# Patient Record
Sex: Male | Born: 1985 | Race: White | Hispanic: No | Marital: Single | Smoking: Never smoker
Health system: Southern US, Community
[De-identification: ages and names within clinical notes are randomized; demographics above are authoritative.]

## PROBLEM LIST (undated history)

## (undated) HISTORY — PX: MOUTH SURGERY: SHX715

---

## 2012-07-26 DIAGNOSIS — Y92410 Unspecified street and highway as the place of occurrence of the external cause: Secondary | ICD-10-CM | POA: Insufficient documentation

## 2012-07-26 DIAGNOSIS — Y9389 Activity, other specified: Secondary | ICD-10-CM | POA: Insufficient documentation

## 2012-07-26 DIAGNOSIS — M62838 Other muscle spasm: Secondary | ICD-10-CM | POA: Insufficient documentation

## 2012-07-27 ENCOUNTER — Emergency Department (HOSPITAL_BASED_OUTPATIENT_CLINIC_OR_DEPARTMENT_OTHER): Payer: No Typology Code available for payment source

## 2012-07-27 ENCOUNTER — Emergency Department (HOSPITAL_BASED_OUTPATIENT_CLINIC_OR_DEPARTMENT_OTHER)
Admission: EM | Admit: 2012-07-27 | Discharge: 2012-07-27 | Disposition: A | Discharge status: Home / Self Care | Payer: No Typology Code available for payment source | Attending: Emergency Medicine | Admitting: Emergency Medicine

## 2012-07-27 ENCOUNTER — Encounter (HOSPITAL_BASED_OUTPATIENT_CLINIC_OR_DEPARTMENT_OTHER): Payer: Self-pay | Admitting: Emergency Medicine

## 2012-07-27 DIAGNOSIS — M62838 Other muscle spasm: Secondary | ICD-10-CM

## 2012-07-27 MED ORDER — IBUPROFEN 400 MG PO TABS
400.0000 mg | ORAL_TABLET | Freq: Once | ORAL | Status: AC
  Administered 2012-07-27: 400 mg via ORAL

## 2012-07-27 MED ORDER — IBUPROFEN 600 MG PO TABS: 600.0000 mg | ORAL_TABLET | Freq: Four times a day (QID) | ORAL | Status: DC | PRN

## 2012-07-27 MED ORDER — IBUPROFEN 400 MG PO TABS: 400.0000 mg | ORAL_TABLET | Freq: Once | ORAL | Status: DC

## 2012-07-27 MED ORDER — IBUPROFEN 400 MG PO TABS
ORAL_TABLET | ORAL | Status: AC
  Administered 2012-07-27: 01:00:00
  Filled 2012-07-27: qty 1
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
## 2012-11-16 ENCOUNTER — Emergency Department
Admission: EM | Admit: 2012-11-16 | Discharge: 2012-11-16 | Disposition: A | Discharge status: Home / Self Care | Payer: Self-pay | Source: Home / Self Care | Attending: Family Medicine | Admitting: Family Medicine

## 2012-11-16 ENCOUNTER — Encounter: Payer: Self-pay | Admitting: Emergency Medicine

## 2012-11-16 DIAGNOSIS — H1045 Other chronic allergic conjunctivitis: Secondary | ICD-10-CM

## 2012-11-16 DIAGNOSIS — H1013 Acute atopic conjunctivitis, bilateral: Secondary | ICD-10-CM

## 2012-11-16 MED ORDER — KETOROLAC TROMETHAMINE 0.5 % OP SOLN: 1.0000 [drp] | Freq: Four times a day (QID) | OPHTHALMIC | Status: DC

## 2012-11-16 NOTE — ED Notes (Signed)
Correction: patient states conjunctivitis began about 1 month ago.

## 2012-11-16 NOTE — ED Notes (Signed)
Reports bilateral conjunctivitis x 3 months; wonders if it started with exposure to cedar tree. Some lid edema.
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
## 2012-11-17 ENCOUNTER — Emergency Department
Admission: EM | Admit: 2012-11-17 | Discharge: 2012-11-17 | Disposition: A | Discharge status: Home / Self Care | Payer: BC Managed Care – PPO | Source: Home / Self Care | Attending: Family Medicine | Admitting: Family Medicine

## 2012-11-17 ENCOUNTER — Encounter: Payer: Self-pay | Admitting: *Deleted

## 2012-11-17 DIAGNOSIS — N390 Urinary tract infection, site not specified: Secondary | ICD-10-CM

## 2012-11-17 LAB — POCT URINALYSIS DIP (MANUAL ENTRY)
Nitrite, UA: NEGATIVE
pH, UA: 7 (ref 5–8)

## 2012-11-17 MED ORDER — NITROFURANTOIN MONOHYD MACRO 100 MG PO CAPS: 100.0000 mg | ORAL_CAPSULE | Freq: Two times a day (BID) | ORAL | Status: DC

## 2012-11-17 NOTE — ED Notes (Signed)
Erik Doyle c/o hematuria and dysuria x this AM

## 2012-11-17 NOTE — ED Provider Notes (Signed)
History    CSN: [id number] Arrival date & time 11/17/12  9528  First MD Initiated Contact with Patient 11/17/12 0932     Chief Complaint  Patient presents with  . Hematuria  . Dysuria     HPI Comments: Patient noticed hematuria this morning, followed by dysuria.  No urgency or hesitancy.  No flank pain.  No fevers, chills, and sweats.  No nausea.  No urethral discharge or testicular pain. He states that he was treated for a kidney infection about 1.5 months ago, initially with Cipro, and changed to Macrobid based on culture results.  No past history of kidney stones.  No family history of kidney stones.  Patient is a 27 y.o. male presenting with hematuria. The history is provided by the patient.  Hematuria This is a new problem. The current episode started less than 1 hour ago. The problem has not changed since onset.Associated symptoms comments: dysuria. Nothing aggravates the symptoms. Nothing relieves the symptoms. He has tried nothing for the symptoms.   History reviewed. No pertinent past medical history. Past Surgical History  Procedure Laterality Date  . Mouth surgery     Family History  Problem Relation Age of Onset  . Hypertension Father    History  Substance Use Topics  . Smoking status: Never Smoker   . Smokeless tobacco: Never Used  . Alcohol Use: Yes     Comment: socially    Review of Systems  Genitourinary: Positive for hematuria.  All other systems reviewed and are negative.    Allergies  Review of patient's allergies indicates no known allergies.  Home Medications   Current Outpatient Rx  Name  Route  Sig  Dispense  Refill  . ibuprofen (ADVIL,MOTRIN) 600 MG tablet   Oral   Take 1 tablet (600 mg total) by mouth every 6 (six) hours as needed for pain.   30 tablet   0   . ketorolac (ACULAR) 0.5 % ophthalmic solution   Both Eyes   Place 1 drop into both eyes 4 (four) times daily.   5 mL   1   . nitrofurantoin, macrocrystal-monohydrate,  (MACROBID) 100 MG capsule   Oral   Take 1 capsule (100 mg total) by mouth 2 (two) times daily.   14 capsule   0    BP 134/83  Pulse 88  Temp(Src) 98.4 F (36.9 C) (Oral)  Resp 12  Ht 5\' 7"  (1.702 m)  Wt 208 lb (94.348 kg)  BMI 32.57 kg/m2  SpO2 98% Physical Exam Nursing notes and Vital Signs reviewed. Appearance:  Patient appears healthy, stated age, and in no acute distress Eyes:  Pupils are equal, round, and reactive to light and accomodation.  Extraocular movement is intact.  Conjunctivae are not inflamed  Mouth/Pharynx:  Normal; moist mucous membranes  Neck:  Supple.  No adenopathy Lungs:  Clear to auscultation.  Breath sounds are equal.  Heart:  Regular rate and rhythm without murmurs, rubs, or gallops.  Abdomen:  Nontender without masses or hepatosplenomegaly.  Bowel sounds are present.  Mild right flank tenderness is present.  Extremities:  No edema.   Skin:  No rash present.   ED Course  Procedures  none  Labs Reviewed  URINE CULTURE pending  POCT URINALYSIS DIP (MANUAL ENTRY):  BIL small; KET trace; BLO large; PRO >= 300 mg/dL; LEU moderate    1. Urinary tract infection, site not specified     MDM  Urine culture pending Empirically begin Macrobid. Increase fluid intake.  Discussed red flags Followup with urologist one week if not improving.  Consider followup with PCP or urologist in one week for repeat urine culture.  Lattie Haw, MD 11/17/12 1013

## 2012-11-19 ENCOUNTER — Telehealth: Payer: Self-pay | Admitting: *Deleted

## 2013-08-23 ENCOUNTER — Emergency Department (HOSPITAL_COMMUNITY): Payer: BC Managed Care – PPO

## 2013-08-23 ENCOUNTER — Emergency Department (HOSPITAL_COMMUNITY)
Admission: EM | Admit: 2013-08-23 | Discharge: 2013-08-23 | Disposition: A | Discharge status: Home / Self Care | Payer: BC Managed Care – PPO | Attending: Emergency Medicine | Admitting: Emergency Medicine

## 2013-08-23 ENCOUNTER — Encounter (HOSPITAL_COMMUNITY): Payer: Self-pay | Admitting: Emergency Medicine

## 2013-08-23 DIAGNOSIS — K921 Melena: Secondary | ICD-10-CM | POA: Insufficient documentation

## 2013-08-23 DIAGNOSIS — Z792 Long term (current) use of antibiotics: Secondary | ICD-10-CM | POA: Insufficient documentation

## 2013-08-23 DIAGNOSIS — Z79899 Other long term (current) drug therapy: Secondary | ICD-10-CM | POA: Insufficient documentation

## 2013-08-23 DIAGNOSIS — Z791 Long term (current) use of non-steroidal anti-inflammatories (NSAID): Secondary | ICD-10-CM | POA: Insufficient documentation

## 2013-08-23 DIAGNOSIS — E860 Dehydration: Secondary | ICD-10-CM | POA: Insufficient documentation

## 2013-08-23 DIAGNOSIS — A088 Other specified intestinal infections: Secondary | ICD-10-CM | POA: Insufficient documentation

## 2013-08-23 DIAGNOSIS — A084 Viral intestinal infection, unspecified: Secondary | ICD-10-CM

## 2013-08-23 LAB — CBC WITH DIFFERENTIAL/PLATELET
BASOS ABS: 0 10*3/uL (ref 0.0–0.1)
BASOS PCT: 0 % (ref 0–1)
Eosinophils Absolute: 0.3 10*3/uL (ref 0.0–0.7)
Eosinophils Relative: 2 % (ref 0–5)
HEMATOCRIT: 43.9 % (ref 39.0–52.0)
Hemoglobin: 15.6 g/dL (ref 13.0–17.0)
Lymphocytes Relative: 10 % — ABNORMAL LOW (ref 12–46)
Lymphs Abs: 1.8 10*3/uL (ref 0.7–4.0)
MCH: 29.9 pg (ref 26.0–34.0)
MCHC: 35.5 g/dL (ref 30.0–36.0)
MCV: 84.3 fL (ref 78.0–100.0)
MONOS PCT: 11 % (ref 3–12)
Monocytes Absolute: 2 10*3/uL — ABNORMAL HIGH (ref 0.1–1.0)
NEUTROS ABS: 13.5 10*3/uL — AB (ref 1.7–7.7)
NEUTROS PCT: 77 % (ref 43–77)
Platelets: 297 10*3/uL (ref 150–400)
RBC: 5.21 MIL/uL (ref 4.22–5.81)
RDW: 13.1 % (ref 11.5–15.5)
WBC: 17.6 10*3/uL — AB (ref 4.0–10.5)

## 2013-08-23 LAB — URINALYSIS, ROUTINE W REFLEX MICROSCOPIC
BILIRUBIN URINE: NEGATIVE
GLUCOSE, UA: NEGATIVE mg/dL
Hgb urine dipstick: NEGATIVE
KETONES UR: NEGATIVE mg/dL
LEUKOCYTES UA: NEGATIVE
Nitrite: NEGATIVE
PH: 5.5 (ref 5.0–8.0)
Protein, ur: NEGATIVE mg/dL
Specific Gravity, Urine: 1.025 (ref 1.005–1.030)
Urobilinogen, UA: 0.2 mg/dL (ref 0.0–1.0)

## 2013-08-23 LAB — LIPASE, BLOOD: Lipase: 34 U/L (ref 11–59)

## 2013-08-23 LAB — COMPREHENSIVE METABOLIC PANEL
ALBUMIN: 4.1 g/dL (ref 3.5–5.2)
ALK PHOS: 69 U/L (ref 39–117)
ALT: 12 U/L (ref 0–53)
AST: 20 U/L (ref 0–37)
BILIRUBIN TOTAL: 0.4 mg/dL (ref 0.3–1.2)
BUN: 22 mg/dL (ref 6–23)
CHLORIDE: 102 meq/L (ref 96–112)
CO2: 23 mEq/L (ref 19–32)
Calcium: 9.1 mg/dL (ref 8.4–10.5)
Creatinine, Ser: 0.93 mg/dL (ref 0.50–1.35)
GFR calc Af Amer: >90 mL/min (ref >90)
GFR calc non Af Amer: >90 mL/min (ref >90)
Glucose, Bld: 112 mg/dL — ABNORMAL HIGH (ref 70–99)
POTASSIUM: 3.9 meq/L (ref 3.7–5.3)
SODIUM: 141 meq/L (ref 137–147)
Total Protein: 7.7 g/dL (ref 6.0–8.3)

## 2013-08-23 LAB — POC OCCULT BLOOD, ED: FECAL OCCULT BLD: POSITIVE — AB

## 2013-08-23 LAB — D-DIMER, QUANTITATIVE: D-Dimer, Quant: <0.27 ug/mL-FEU (ref 0.00–0.48)

## 2013-08-23 MED ORDER — IOHEXOL 300 MG/ML  SOLN
80.0000 mL | Freq: Once | INTRAMUSCULAR | Status: AC | PRN
  Administered 2013-08-23: 80 mL via INTRAVENOUS

## 2013-08-23 MED ORDER — ONDANSETRON HCL 4 MG PO TABS: 4.0000 mg | ORAL_TABLET | Freq: Four times a day (QID) | ORAL | Status: DC

## 2013-08-23 MED ORDER — ONDANSETRON HCL 4 MG/2ML IJ SOLN
4.0000 mg | Freq: Once | INTRAMUSCULAR | Status: AC
  Administered 2013-08-23: 4 mg via INTRAVENOUS
  Filled 2013-08-23: qty 2

## 2013-08-23 MED ORDER — SODIUM CHLORIDE 0.9 % IV BOLUS (SEPSIS)
500.0000 mL | Freq: Once | INTRAVENOUS | Status: AC
  Administered 2013-08-23: 500 mL via INTRAVENOUS

## 2013-08-23 MED ORDER — SODIUM CHLORIDE 0.9 % IV BOLUS (SEPSIS)
1000.0000 mL | Freq: Once | INTRAVENOUS | Status: AC
  Administered 2013-08-23: 1000 mL via INTRAVENOUS

## 2013-08-23 MED ORDER — IOHEXOL 300 MG/ML  SOLN
25.0000 mL | INTRAMUSCULAR | Status: AC
  Administered 2013-08-23: 25 mL via ORAL

## 2013-08-23 MED ORDER — ONDANSETRON 4 MG PO TBDP
8.0000 mg | ORAL_TABLET | Freq: Once | ORAL | Status: AC
  Administered 2013-08-23: 8 mg via ORAL
  Filled 2013-08-23: qty 2

## 2013-08-23 MED ORDER — MORPHINE SULFATE 4 MG/ML IJ SOLN
4.0000 mg | Freq: Once | INTRAMUSCULAR | Status: AC
  Administered 2013-08-23: 4 mg via INTRAVENOUS
  Filled 2013-08-23: qty 1

## 2013-08-23 NOTE — ED Provider Notes (Signed)
CSN: [id number]     Arrival date & time 08/23/13  0600 History   First MD Initiated Contact with Patient 08/23/13 [phone]     Chief Complaint  Patient presents with  . Abdominal Pain     (Consider location/radiation/quality/duration/timing/severity/associated sxs/prior Treatment) The history is provided by the patient. No language interpreter was used.  Erik Doyle is a 28 y/o M with no known significant PMHx presenting to the ED with abdominal pain that has been ongoing for the past 3 days with intensity increase over the past 2 days. Patient reported that the abdominal pain is localized to the RUQ described as a sharp pain intermittently when generalized cramping to the entire abdomen - reported that the pain worsens with eating and drinking. Patient reported that he has been having loose stool BM - reported at least 4 per day that has now increased to 13 within the past 12 hours. Reported that he has noticed mild black stools at one point. Reported that he has been having nausea with emesis - at least 8 episodes today within the past 12 hours - NB/NB - reported to be mainly phlegm. 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 Denied chest pain, difficulty breathing, numbness, tingling, dysuria, hematuria, hematochezia, fever, dizziness, weakness, sick contacts, abdominal surgery. PCP none    History reviewed. No pertinent past medical history. Past Surgical History  Procedure Laterality Date  . Mouth surgery     Family History  Problem Relation Age of Onset  . Hypertension Father    History  Substance Use Topics  . Smoking status: Never Smoker   . Smokeless tobacco: Never Used  . Alcohol Use: Yes     Comment: socially    Review of Systems  Constitutional: Positive for fever (Subjective). Negative for chills.  Respiratory: Negative for chest tightness and shortness of breath.   Cardiovascular: Negative for chest pain.   Gastrointestinal: Positive for nausea, vomiting, abdominal pain, diarrhea and blood in stool. Negative for constipation and anal bleeding.  Genitourinary: Negative for dysuria, hematuria and decreased urine volume.  Neurological: Positive for light-headedness. Negative for dizziness and weakness.  All other systems reviewed and are negative.      Allergies  Review of patient's allergies indicates no known allergies.  Home Medications   Current Outpatient Rx  Name  Route  Sig  Dispense  Refill  . amoxicillin (AMOXIL) 500 MG capsule   Oral   Take 500 mg by mouth 3 (three) times daily. For 7 days         . cyclobenzaprine (FLEXERIL) 10 MG tablet   Oral   Take 10 mg by mouth at bedtime.         . DimenhyDRINATE (DRAMAMINE PO)   Oral   Take 1 tablet by mouth at bedtime as needed (for nause).         . naproxen sodium (ANAPROX) 550 MG tablet   Oral   Take 550 mg by mouth 2 (two) times daily with a meal.         . ondansetron (ZOFRAN) 4 MG tablet   Oral   Take 1 tablet (4 mg total) by mouth every 6 (six) hours.   12 tablet   0    BP 111/56  Pulse 95  Temp(Src) 97.8 F (36.6 C) (Oral)  Resp 18  Ht 5\' 7"  (1.702 m)  Wt 260 lb (117.935 kg)  BMI 40.71 kg/m2  SpO2 99% Physical Exam  Nursing note and vitals reviewed. Constitutional: He is oriented to person, place, and time. He appears well-developed and well-nourished. No distress.  HENT:  Head: Normocephalic and atraumatic.  Mouth/Throat: Oropharynx is clear and moist. No oropharyngeal exudate.  Eyes: Conjunctivae and EOM are normal. Pupils are equal, round, and reactive to light. Right eye exhibits no discharge. Left eye exhibits no discharge.  Neck: Normal range of motion. Neck supple. No tracheal deviation present.  Cardiovascular: Normal rate, regular rhythm and normal heart sounds.  Exam reveals no friction rub.   No murmur heard. Pulses:      Radial pulses are 2+ on the right side, and 2+ on the left  side.       Dorsalis pedis pulses are 2+ on the right side, and 2+ on the left side.  Pulmonary/Chest: Effort normal and breath sounds normal. No respiratory distress. He has no wheezes. He has no rales.  Abdominal: Soft. Bowel sounds are normal. There is tenderness. There is guarding.  Negative abdominal distension Discomfort upon palpation to the RUQ and RLQ regions upon palpation Positive Murphy's sign Positive McBurney's point  Genitourinary:  Rectal Exam: Negative swelling, erythema, inflammation, lesions, sores noted to the anus. Negative external hemorrhoids. Strong tone. Negative patient masses of the rectum. Negative bright red blood on glove. Stools have yellowish discoloration noted to glove. Exam chaperoned with nurse.  Musculoskeletal: Normal range of motion.  Full ROM to upper and lower extremities without difficulty noted, negative ataxia noted.  Lymphadenopathy:    He has no cervical adenopathy.  Neurological: He is alert and oriented to person, place, and time. No cranial nerve deficit. He exhibits normal muscle tone. Coordination normal.  Skin: Skin is warm and dry. No rash noted. He is not diaphoretic. No erythema.  Psychiatric: He has a normal mood and affect. His behavior is normal. Thought content normal.    ED Course  Procedures (including critical care time)  Upon arrival to the ED patient's heart rate was 122 beats per minute, has gone down to 110 beats per minute. Even after 2 rounds of fluid approximately 150 mL patient continues to have tachycardia ranging from 105-110 beats per minute. Patient reports intermittent shortness of breath. Will obtain d-dimer to rule out possible PE.  Results for orders placed during the hospital encounter of 08/23/13  CBC WITH DIFFERENTIAL      Result Value Ref Range   WBC 17.6 (*) 4.0 - 10.5 K/uL   RBC 5.21  4.22 - 5.81 MIL/uL   Hemoglobin 15.6  13.0 - 17.0 g/dL   HCT 16.143.9  09.639.0 - 04.552.0 %   MCV 84.3  78.0 - 100.0 fL   MCH  29.9  26.0 - 34.0 pg   MCHC 35.5  30.0 - 36.0 g/dL   RDW 40.913.1  81.111.5 - 91.415.5 %   Platelets 297  150 - 400 K/uL   Neutrophils Relative % 77  43 - 77 %   Neutro Abs 13.5 (*) 1.7 - 7.7 K/uL   Lymphocytes Relative 10 (*) 12 - 46 %   Lymphs Abs 1.8  0.7 - 4.0 K/uL   Monocytes Relative 11  3 - 12 %   Monocytes Absolute 2.0 (*) 0.1 - 1.0 K/uL   Eosinophils Relative 2  0 - 5 %   Eosinophils Absolute 0.3  0.0 - 0.7 K/uL   Basophils Relative 0  0 - 1 %   Basophils Absolute 0.0  0.0 - 0.1 K/uL  COMPREHENSIVE METABOLIC PANEL  Result Value Ref Range   Sodium 141  137 - 147 mEq/L   Potassium 3.9  3.7 - 5.3 mEq/L   Chloride 102  96 - 112 mEq/L   CO2 23  19 - 32 mEq/L   Glucose, Bld 112 (*) 70 - 99 mg/dL   BUN 22  6 - 23 mg/dL   Creatinine, Ser 4.09  0.50 - 1.35 mg/dL   Calcium 9.1  8.4 - 81.1 mg/dL   Total Protein 7.7  6.0 - 8.3 g/dL   Albumin 4.1  3.5 - 5.2 g/dL   AST 20  0 - 37 U/L   ALT 12  0 - 53 U/L   Alkaline Phosphatase 69  39 - 117 U/L   Total Bilirubin 0.4  0.3 - 1.2 mg/dL   GFR calc non Af Amer >90  >90 mL/min   GFR calc Af Amer >90  >90 mL/min  LIPASE, BLOOD      Result Value Ref Range   Lipase 34  11 - 59 U/L  URINALYSIS, ROUTINE W REFLEX MICROSCOPIC      Result Value Ref Range   Color, Urine YELLOW  YELLOW   APPearance CLEAR  CLEAR   Specific Gravity, Urine 1.025  1.005 - 1.030   pH 5.5  5.0 - 8.0   Glucose, UA NEGATIVE  NEGATIVE mg/dL   Hgb urine dipstick NEGATIVE  NEGATIVE   Bilirubin Urine NEGATIVE  NEGATIVE   Ketones, ur NEGATIVE  NEGATIVE mg/dL   Protein, ur NEGATIVE  NEGATIVE mg/dL   Urobilinogen, UA 0.2  0.0 - 1.0 mg/dL   Nitrite NEGATIVE  NEGATIVE   Leukocytes, UA NEGATIVE  NEGATIVE  D-DIMER, QUANTITATIVE      Result Value Ref Range   D-Dimer, Quant <0.27  0.00 - 0.48 ug/mL-FEU  POC OCCULT BLOOD, ED      Result Value Ref Range   Fecal Occult Bld POSITIVE (*) NEGATIVE   Labs Review Labs Reviewed  CBC WITH DIFFERENTIAL - Abnormal; Notable for the  following:    WBC 17.6 (*)    Neutro Abs 13.5 (*)    Lymphocytes Relative 10 (*)    Monocytes Absolute 2.0 (*)    All other components within normal limits  COMPREHENSIVE METABOLIC PANEL - Abnormal; Notable for the following:    Glucose, Bld 112 (*)    All other components within normal limits  POC OCCULT BLOOD, ED - Abnormal; Notable for the following:    Fecal Occult Bld POSITIVE (*)    All other components within normal limits  LIPASE, BLOOD  URINALYSIS, ROUTINE W REFLEX MICROSCOPIC  D-DIMER, QUANTITATIVE   Imaging Review Ct Abdomen Pelvis W Contrast  08/23/2013   CLINICAL DATA:  Periumbilical abdominal pain, diarrhea, fever and elevated white blood cell count.  EXAM: CT ABDOMEN AND PELVIS WITH CONTRAST  TECHNIQUE: Multidetector CT imaging of the abdomen and pelvis was performed using the standard protocol following bolus administration of intravenous contrast.  CONTRAST:  80mL OMNIPAQUE IOHEXOL 300 MG/ML  SOLN  COMPARISON:  None.  FINDINGS: No acute inflammatory process or abnormal fluid collection is identified by CT. Bowel loops show normal caliber without evidence of obstruction, thickening or lesion. No significant diverticular disease is identified.  The liver shows mild steatosis. No focal lesion or biliary ductal dilatation is identified. The gallbladder, pancreas, spleen and adrenal glands are within normal limits. There are two small adjacent nonobstructing calculi in the lower pole of the left kidney. The larger measures 6 mm. No hydronephrosis, ureteral  calculi or bladder abnormalities are identified.  There is no evidence of mass or lymphadenopathy. No hernias are identified. No vascular abnormalities are seen. Bony structures are unremarkable.  IMPRESSION: No acute findings in the abdomen or pelvis by CT. The liver shows mild steatosis. Small nonobstructing calculi are present in the lower pole of the left kidney.   Electronically Signed   By: Irish Lack M.D.   On: 08/23/2013  09:26     EKG Interpretation None      MDM   Final diagnoses:  Viral gastroenteritis  Dehydration   Medications  iohexol (OMNIPAQUE) 300 MG/ML solution 25 mL (25 mLs Oral Contrast Given 08/23/13 0755)  ondansetron (ZOFRAN-ODT) disintegrating tablet 8 mg (8 mg Oral Given 08/23/13 0623)  sodium chloride 0.9 % bolus 1,000 mL (0 mLs Intravenous Stopped 08/23/13 0946)  iohexol (OMNIPAQUE) 300 MG/ML solution 80 mL (80 mLs Intravenous Contrast Given 08/23/13 0917)  morphine 4 MG/ML injection 4 mg (4 mg Intravenous Given 08/23/13 1017)  ondansetron (ZOFRAN) injection 4 mg (4 mg Intravenous Given 08/23/13 1017)  sodium chloride 0.9 % bolus 500 mL (0 mLs Intravenous Stopped 08/23/13 1326)   Filed Vitals:   08/23/13 0645 08/23/13 0646 08/23/13 1226 08/23/13 1352  BP: 113/59  106/66 111/56  Pulse:  110 100 95  Temp:   99 F (37.2 C) 97.8 F (36.6 C)  TempSrc:   Oral Oral  Resp:   16 18  Height:      Weight:      SpO2:  95% 99% 99%    Patient presenting to the ED with abdominal pain does been ongoing for the past 3 days with intensity increase over the past 2. Reported that the abdominal pain is localized to right upper quadrant a sharp shooting pain with generalized abdominal cramping. Associated symptoms of diarrhea, nausea, vomiting. Alert oriented. GCS 15. Heart rate and rhythm normal. Lungs clear to auscultation to upper and lower lobes bilaterally. Radial and DP pulses 2+. Negative abdominal distention. Bowel sounds normal active in all 4 quadrants. Discomfort upon palpation to the right upper quadrant. Positive Murphy sign and positive McBurney's point. Rectal exam identified yellowish thin liquid stools with negative blood to glove. CBC noted mildly elevated white blood cell count of 17.6. CMP negative findings-liver and kidney functioning well. Lipase negative elevation. D-dimer negative elevation. Urinalysis negative for nitrites and leukocytes-negative findings of infection. Fecal  occult positive - negative drop in Hgb noted, doubt acute bleed. UA negative signs of infection-negative nitrites and leukocytes identified. CT abdomen and pelvis negative for acute abdominal processes - liver shows steatosis. Small non-obstructing calculi are present within the left kidney.  Doubt PE. Negative findings for acute abdominal processes. Doubt appendicitis. Doubt pancreatitis. Suspicion of tachycardia secondary to dehydration after vomiting and diarrhea. Patient hydrated in ED setting with a heart rate lowering from 122 beats per minute to 100 beats per minute after IV fluids administered. Patient able to tolerate fluids PO without episodes of emesis while in ED setting. Suspicion to be viral gastroenteritis with abdominal pain, nausea, vomiting, diarrhea. Patient stable, afebrile. Heart rate controlled in ED setting. Discharged patient. Discussed with patient to rest and stay hydrated. Discussed with patient BRAT diet. Discussed with patient to closely monitor symptoms and if symptoms are to worsen or change to report back to the ED - strict return instructions given.  Patient agreed to plan of care, understood, all questions answered.    Raymon Mutton, PA-C 08/23/13 [phone]

## 2013-08-23 NOTE — Discharge Instructions (Signed)
Please call your doctor for a followup appointment within 24-48 hours. When you talk to your doctor please let them know that you were seen in the emergency department and have them acquire all of your records so that they can discuss the findings with you and formulate a treatment plan to fully care for your new and ongoing problems. Please rest and stay hydrated-please drink plenty of fluids Please avoid any physical or strenuous activity Please take antinausea medications as prescribed Please follow a brat diet in order to control diarrhea Please continue to monitor symptoms closely and if symptoms are to worsen or change (fever greater than 101, nausea, vomiting, shortness of breath, difficulty breathing, chest pain, worsening or changes to abdominal pain, numbness or tingling, weakness, dizziness, fainting) please report back to the ED immediately   Dehydration, Adult Dehydration means your body does not have as much fluid as it needs. Your kidneys, brain, and heart will not work properly without the right amount of fluids and salt.  HOME CARE  Ask your doctor how to replace body fluid losses (rehydrate).  Drink enough fluids to keep your pee (urine) clear or pale yellow.  Drink small amounts of fluids often if you feel sick to your stomach (nauseous) or throw up (vomit).  Eat like you normally do.  Avoid:  Foods or drinks high in sugar.  Bubbly (carbonated) drinks.  Juice.  Very hot or cold fluids.  Drinks with caffeine.  Fatty, greasy foods.  Alcohol.  Tobacco.  Eating too much.  Gelatin desserts.  Wash your hands to avoid spreading germs (bacteria, viruses).  Only take medicine as told by your doctor.  Keep all doctor visits as told. GET HELP RIGHT AWAY IF:   You cannot drink something without throwing up.  You get worse even with treatment.  Your vomit has blood in it or looks greenish.  Your poop (stool) has blood in it or looks black and tarry.  You  have not peed in 6 to 8 hours.  You pee a small amount of very dark pee.  You have a fever.  You pass out (faint).  You have belly (abdominal) pain that gets worse or stays in one spot (localizes).  You have a rash, stiff neck, or bad headache.  You get easily annoyed, sleepy, or are hard to wake up.  You feel weak, dizzy, or very thirsty. MAKE SURE YOU:   Understand these instructions.  Will watch your condition.  Will get help right away if you are not doing well or get worse. Document Released: 03/09/2009 Document Revised: 08/05/2011 Document Reviewed: 12/31/2010 [facility] Patient Information 2014 [place], [place].  Diet for Diarrhea, Adult Frequent, runny stools (diarrhea) may be caused or worsened by food or drink. Diarrhea may be relieved by changing your diet. Since diarrhea can last up to 7 days, it is easy for you to lose too much fluid from the body and become dehydrated. Fluids that are lost need to be replaced. Along with a modified diet, make sure you drink enough fluids to keep your urine clear or pale yellow. DIET INSTRUCTIONS Ensure adequate fluid intake (hydration): have 1 cup (8 oz) of fluid for each diarrhea episode. Avoid fluids that contain simple sugars or sports drinks, fruit juices, whole milk products, and sodas. Your urine should be clear or pale yellow if you are drinking enough fluids. Hydrate with an oral rehydration solution that you can purchase at pharmacies, retail stores, and online. You can prepare an oral rehydration  solution at home by mixing the following ingredients together:   tsp table salt.  tsp baking soda.  tsp salt substitute containing potassium chloride. 1  tablespoons sugar. 1 L (34 oz) of water. Certain foods and beverages may increase the speed at which food moves through the gastrointestinal (GI) tract. These foods and beverages should be avoided and include: Caffeinated and alcoholic beverages. High-fiber foods, such as raw fruits  and vegetables, nuts, seeds, and whole grain breads and cereals. Foods and beverages sweetened with sugar alcohols, such as xylitol, sorbitol, and mannitol. Some foods may be well tolerated and may help thicken stool including: Starchy foods, such as rice, toast, pasta, low-sugar cereal, oatmeal, grits, baked potatoes, crackers, and bagels.  Bananas.  Applesauce. Add probiotic-rich foods to help increase healthy bacteria in the GI tract, such as yogurt and fermented milk products. RECOMMENDED FOODS AND BEVERAGES Starches Choose foods with less than 2 g of fiber per serving. Recommended:  White, [place], and pita breads, plain rolls, buns, bagels. Plain muffins, matzo. Soda, saltine, or graham crackers. Pretzels, melba toast, zwieback. Cooked cereals made with water: cornmeal, farina, cream cereals. Dry cereals: refined corn, wheat, rice. Potatoes prepared any way without skins, refined macaroni, spaghetti, noodles, refined rice. Avoid:  Bread, rolls, or crackers made with whole wheat, multi-grains, rye, bran seeds, nuts, or coconut. Corn tortillas or taco shells. Cereals containing whole grains, multi-grains, bran, coconut, nuts, raisins. Cooked or dry oatmeal. Coarse wheat cereals, granola. Cereals advertised as "high-fiber." Potato skins. Whole grain pasta, wild or brown rice. Popcorn. Sweet potatoes, yams. Sweet rolls, doughnuts, waffles, pancakes, sweet breads. Vegetables Recommended: Strained tomato and vegetable juices. Most well-cooked and canned vegetables without seeds. Fresh: Tender lettuce, cucumber without the skin, cabbage, spinach, bean sprouts. Avoid: Fresh, cooked, or canned: Artichokes, baked beans, beet greens, broccoli, Brussels sprouts, corn, kale, legumes, peas, sweet potatoes. Cooked: Green or red cabbage, spinach. Avoid large servings of any vegetables because vegetables shrink when cooked, and they contain more fiber per serving than fresh vegetables. Fruit Recommended:  Cooked or canned: Apricots, applesauce, cantaloupe, cherries, fruit cocktail, grapefruit, grapes, kiwi, mandarin oranges, peaches, pears, plums, watermelon. Fresh: Apples without skin, ripe banana, grapes, cantaloupe, cherries, grapefruit, peaches, oranges, plums. Keep servings limited to  cup or 1 piece. Avoid: Fresh: Apples with skin, apricots, mangoes, pears, raspberries, strawberries. Prune juice, stewed or dried prunes. Dried fruits, raisins, dates. Large servings of all fresh fruits. Protein Recommended: Ground or well-cooked tender beef, ham, veal, lamb, pork, or poultry. Eggs. Fish, oysters, shrimp, lobster, other seafoods. Liver, organ meats. Avoid: Tough, fibrous meats with gristle. Peanut butter, smooth or chunky. Cheese, nuts, seeds, legumes, dried peas, beans, lentils. Dairy Recommended: Yogurt, lactose-free milk, kefir, drinkable yogurt, buttermilk, soy milk, or plain hard cheese. Avoid: Milk, chocolate milk, beverages made with milk, such as milkshakes. Soups Recommended: Bouillon, broth, or soups made from allowed foods. Any strained soup. Avoid: Soups made from vegetables that are not allowed, cream or milk-based soups. Desserts and Sweets Recommended: Sugar-free gelatin, sugar-free frozen ice pops made without sugar alcohol. Avoid: Plain cakes and cookies, pie made with fruit, pudding, custard, cream pie. Gelatin, fruit, ice, sherbet, frozen ice pops. Ice cream, ice milk without nuts. Plain hard candy, honey, jelly, molasses, syrup, sugar, chocolate syrup, gumdrops, marshmallows. Fats and Oils Recommended: Limit fats to less than 8 tsp per day. Avoid: Seeds, nuts, olives, avocados. Margarine, butter, cream, mayonnaise, salad oils, plain salad dressings. Plain gravy, crisp bacon without rind. Beverages Recommended: Water, decaffeinated teas, oral  rehydration solutions, sugar-free beverages not sweetened with sugar alcohols. Avoid: Fruit juices, caffeinated beverages (coffee, tea,  soda), alcohol, sports drinks, or lemon-lime soda. Condiments Recommended: Ketchup, mustard, horseradish, vinegar, cocoa powder. Spices in moderation: allspice, basil, bay leaves, celery powder or leaves, cinnamon, cumin powder, curry powder, ginger, mace, marjoram, onion or garlic powder, oregano, paprika, parsley flakes, ground pepper, rosemary, sage, savory, tarragon, thyme, turmeric. Avoid: Coconut, honey. Document Released: 08/03/2003 Document Revised: 02/05/2012 Document Reviewed: 09/27/2011 [facility] Patient Information 2014 [place], [place]. Viral Gastroenteritis Viral gastroenteritis is also known as stomach flu. This condition affects the stomach and intestinal tract. It can cause sudden diarrhea and vomiting. The illness typically lasts 3 to 8 days. Most people develop an immune response that eventually gets rid of the virus. While this natural response develops, the virus can make you quite ill. CAUSES  Many different viruses can cause gastroenteritis, such as rotavirus or noroviruses. You can catch one of these viruses by consuming contaminated food or water. You may also catch a virus by sharing utensils or other personal items with an infected person or by touching a contaminated surface. SYMPTOMS  The most common symptoms are diarrhea and vomiting. These problems can cause a severe loss of body fluids (dehydration) and a body salt (electrolyte) imbalance. Other symptoms may include:  Fever.  Headache.  Fatigue.  Abdominal pain. DIAGNOSIS  Your caregiver can usually diagnose viral gastroenteritis based on your symptoms and a physical exam. A stool sample may also be taken to test for the presence of viruses or other infections. TREATMENT  This illness typically goes away on its own. Treatments are aimed at rehydration. The most serious cases of viral gastroenteritis involve vomiting so severely that you are not able to keep fluids down. In these cases, fluids must be given through  an intravenous line (IV). HOME CARE INSTRUCTIONS   Drink enough fluids to keep your urine clear or pale yellow. Drink small amounts of fluids frequently and increase the amounts as tolerated.  Ask your caregiver for specific rehydration instructions.  Avoid:  Foods high in sugar.  Alcohol.  Carbonated drinks.  Tobacco.  Juice.  Caffeine drinks.  Extremely hot or cold fluids.  Fatty, greasy foods.  Too much intake of anything at one time.  Dairy products until 24 to 48 hours after diarrhea stops.  You may consume probiotics. Probiotics are active cultures of beneficial bacteria. They may lessen the amount and number of diarrheal stools in adults. Probiotics can be found in yogurt with active cultures and in supplements.  Wash your hands well to avoid spreading the virus.  Only take over-the-counter or prescription medicines for pain, discomfort, or fever as directed by your caregiver. Do not give aspirin to children. Antidiarrheal medicines are not recommended.  Ask your caregiver if you should continue to take your regular prescribed and over-the-counter medicines.  Keep all follow-up appointments as directed by your caregiver. SEEK IMMEDIATE MEDICAL CARE IF:   You are unable to keep fluids down.  You do not urinate at least once every 6 to 8 hours.  You develop shortness of breath.  You notice blood in your stool or vomit. This may look like coffee grounds.  You have abdominal pain that increases or is concentrated in one small area (localized).  You have persistent vomiting or diarrhea.  You have a fever.  The patient is a child younger than 3 months, and he or she has a fever.  The patient is a child older than 3  months, and he or she has a fever and persistent symptoms.  The patient is a child older than 3 months, and he or she has a fever and symptoms suddenly get worse.  The patient is a baby, and he or she has no tears when crying. MAKE SURE YOU:     Understand these instructions.  Will watch your condition.  Will get help right away if you are not doing well or get worse. Document Released: 05/13/2005 Document Revised: 08/05/2011 Document Reviewed: 02/27/2011 [facility] Patient Information 2014 [place], [place].   Emergency Department Resource Guide 1) Find a Doctor and Pay Out of Pocket Although you won't have to find out who is covered by your insurance plan, it is a good idea to ask around and get recommendations. You will then need to call the office and see if the doctor you have chosen will accept you as a new patient and what types of options they offer for patients who are self-pay. Some doctors offer discounts or will set up payment plans for their patients who do not have insurance, but you will need to ask so you aren't surprised when you get to your appointment.  2) Contact Your Local Health Department Not all health departments have doctors that can see patients for sick visits, but many do, so it is worth a call to see if yours does. If you don't know where your local health department is, you can check in your phone book. The CDC also has a tool to help you locate your state's health department, and many state websites also have listings of all of their local health departments.  3) Find a Walk-in Clinic If your illness is not likely to be very severe or complicated, you may want to try a walk in clinic. These are popping up all over the country in pharmacies, drugstores, and shopping centers. They're usually staffed by nurse practitioners or physician assistants that have been trained to treat common illnesses and complaints. They're usually fairly quick and inexpensive. However, if you have serious medical issues or chronic medical problems, these are probably not your best option.  No Primary Care Doctor: - Call Health Connect at  [phone] - they can help you locate a primary care doctor that  accepts your insurance,  provides certain services, etc. - Physician Referral Service- [phone]  Chronic Pain Problems: Organization         Address  Phone   Notes  [facility]  [phone] Patients need to be referred by their primary care doctor.   Medication Assistance: Organization         Address  Phone   Notes  [facility] Medication [facility] [address] [place] [place], [place] [address] [phone] --Must be a resident of [facility] -- Must have NO insurance coverage whatsoever (no Medicaid/ Medicare, etc.) -- The pt. MUST have a primary care doctor that directs their care regularly and follows them in the community   Med[facility]ist  [phone]   [employer]  [phone]    Agencies that provide inexpensive medical care: Organization         Address  Phone   Notes  [facility]  [phone]   [facility]    [phone]   [facility] [address] [place], [place] [address] [phone]   [facility] [address] [place] [place] [phone]  Planned Parenthood    [phone]   [facility]    [phone]   Community Health and [place] [address], [place] Phone:  [phone], Fax:  [phone] Hours of Operation:  9 am - 6 pm, M-F.  Also accepts Medicaid/Medicare and self-pay.  [facility] for [place], [address], [place] Phone: [phone], Fax: [phone]. Hours of Operation:  8:30 am - 5:30 pm, M-F.  Also accepts Medicaid and self-pay.  [facility] [address] [place] Phone: [phone]   [place], [place], [place] [phone], Ext. [phone] Mondays & Thursdays: 7-9 AM.  First 15 patients are seen on a first come, first serve basis.    [place] Providers:  Organization         Address  Phone    Notes  [facility] [address], [place] [phone] Also accepts self-pay patients.  [facility] [address], [place]  [phone]   [place], [address], [place] [phone]   [facility] [address] [place] [phone]   Lucianne Lei [address], [place]   [phone] Only accepts [place] Access [place] patients after they have their name applied to their card.   Self-Pay (no insurance) in [facility]:  Organization         Address  Phone   Notes  Sickle Cell Patients, [facility] Internal Medicine [place] [phone]   [facility] [place] [phone]   [facility] [place]  [place], [place], [place] [phone]   [facility]/Dr. Osei-Bonsu  [address] [place] or [address], [place] [phone] Phone number for both [place] and [place] locations is the same.  Urgent Medical and [facility] [address], [place]  [phone]   [facility] [address] [place] or [address] [phone]   [facility] [address] [place] [phone], phone; [phone], fax Sees patients 1st and 3rd Saturday of every month.  Must not qualify for public or private insurance (i.e. Medicaid, Medicare, [place] Health Choice, Veterans' Benefits)  Household income should be no more than 200% of the poverty level The clinic cannot treat you if you are pregnant or think you are pregnant  Sexually transmitted diseases are not treated at the clinic.    Dental Care: Organization         Address  Phone  Notes  [facility] Department of [facility] [phone] Accepts children up to age 67 who are enrolled in [place] or [place]; pregnant women with a Medicaid card; and children who have applied for Medicaid or [place] Health Choice, but were declined, whose parents can pay a reduced fee at time of service.  [facility] Department of [facility]  [address], [place] [phone] Accepts children up to age 71 who are enrolled in [place] or [place]; pregnant women with a Medicaid card; and children who have applied for Medicaid or [place], but were declined,  whose parents can pay a reduced fee at time of service.  [place] [facility] Access PRO[facility]  [place] [phone] Patients are seen by appointment only. Walk-ins are not accepted. [place] will see patients 91 years of age and older. Monday - Tuesday (8am-5pm) Most Wednesdays (8:30-5pm) $30 per visit, cash only  [facility] Adult Dental Access PROGRAM  [address], [facility] [phone] Patients are seen by appointment only. Walk-ins are not accepted. [place] will see patients 79 years of age and older. One Wednesday Evening (Monthly: Volunteer Based).  $30 per visit, cash only  [place]  [phone] for adults; Children under age 45, call Graduate Pediatric Dentistry at [phone]. Children aged 20-14, please call [phone] to request a pediatric application.  Dental services are provided in all areas of dental care including fillings, crowns and bridges, complete and partial dentures, implants, gum treatment, root canals, and extractions. Preventive care is also provided. Treatment is provided to both adults and children. Patients are selected via a lottery and there is often a waiting list.   [facility] [address] [place]  [phone] [email or web address]   [facility] [address] [place], [place] [phone], Ext.  [phone] Second and Fourth Thursday of each month, opens at 6:30 AM; Clinic ends at 9 AM.  Patients are seen on a first-come first-served basis, and a limited number are seen during each clinic.   [facility]  [address] Hillard Danker [place], [place] [phone]   Eligibility Requirements You must have lived in [place], [place], or [place] counties for at least the last three months.   You cannot be eligible for state or federal sponsored [employer], including [employer], [place], or [employer].   You generally cannot be eligible for healthcare insurance through your employer.    How to apply: Eligibility screenings are held every Tuesday and Wednesday afternoon from 1:00 pm until 4:00 pm. You do not need an appointment for the interview!  [facility] [address], [place], [place]   [place]  [place] Department  [place]  [phone]    Behavioral Health Resources in the Community: Intensive Outpatient Programs Organization         Address  Phone  Notes  Harrietta [place]. [address] [place], [place] [phone]   [facility] Outpatient [address] [place], [place]   ADS: [facility] Drug [facility] [address] [place], [place]   [place] [address]  [place], [place] or [phone]   Substance Abuse Resources Organization         Address  Phone  Notes  Alcohol and Drug Services  [phone]   [place]  [phone]   [facility] [place]   Chinita Pester  [phone]   Residential & Outpatient Substance Abuse Program  [phone]   Psychological Services Organization         Address  Phone  Notes  [facility] [place]  [place]  [phone]   [place] [address] [place] or [phone]    Mobile Crisis Teams Organization         Address  Phone  Notes  [facility], Mobile  Crisis Care Unit  [phone]   [facility] Psychotherapeutic Services  [address] [place], [place]   [facility] [address] [place] [phone]    Self-Help/Support Groups Organization         Address  Phone             Notes  [place] of [place] - variety of support groups  [place] Call for more information  Narcotics Anonymous (NA), [facility] [address], [employer] [place]  2 meetings at this location   Special educational needs teacher         Address  Phone  Notes  [facility] Residential Treatment [place],    [place]  [phone]   [facility]  [address], [place] [phone], [place], [place]   [place], [place] [phone] Admissions: 8am-3pm M-F  Incentives Substance [place] [address],    [place], [place] [phone]   [facility] [address] [place], [place], [place]   [facility] [address]  [place], [place]   [facility] Programs - Intensive Outpatient [address]een [address], [place], [place]   [facility] [address]  [place], [place] [phone] or [phone]   [facility] (RTS) [address] [place], [place] Accepts Medicaid  [facility] [place] [address]  [place] [phone] Substance Abuse/Addiction Treatment   [facility] Organization         Address  Phone  Notes  [facility]  [phone]   Domenic Schwab, PhD [address] Arlis [place], [place]   [phone] or [phone]   [facility] [place]  [place], [place] [phone]   [facility] [address], [place], [place] [phone] Insurance/Medicaid/sponsorship through [facility] and Families [address], [address] [place]                                    [place], [place] [phone] [place] [address], [place] [phone]    Dr. Adele Schilder  [phone]   [facility] Dept. 1) [address], [place] 2) [place] 3)  [place] [address], [place] [phone]  [phone]   [place] [phone] or [phone] (After Hours)

## 2013-08-23 NOTE — ED Notes (Signed)
The pt is c/o abd pain with vomting and diarrhea for 5 days he has been on antibiotics for 2 different complaints

## 2013-08-24 NOTE — ED Provider Notes (Signed)
Medical screening examination/treatment/procedure(s) were performed by non-physician practitioner and as supervising physician I was immediately available for consultation/collaboration.   EKG Interpretation None        Audree CamelScott T Cherylynn Liszewski, MD 08/24/13 1537

## 2013-10-26 ENCOUNTER — Ambulatory Visit (INDEPENDENT_AMBULATORY_CARE_PROVIDER_SITE_OTHER): Payer: BC Managed Care – PPO | Admitting: Family Medicine

## 2013-10-26 ENCOUNTER — Ambulatory Visit: Payer: BC Managed Care – PPO

## 2013-10-26 VITALS — BP 110/73 | HR 88 | Temp 98.3°F | Resp 16 | Ht 67.5 in | Wt 207.4 lb

## 2013-10-26 DIAGNOSIS — M25531 Pain in right wrist: Secondary | ICD-10-CM

## 2013-10-26 DIAGNOSIS — R202 Paresthesia of skin: Secondary | ICD-10-CM

## 2013-10-26 DIAGNOSIS — R209 Unspecified disturbances of skin sensation: Secondary | ICD-10-CM

## 2013-10-26 DIAGNOSIS — M25539 Pain in unspecified wrist: Secondary | ICD-10-CM

## 2013-10-26 DIAGNOSIS — G5600 Carpal tunnel syndrome, unspecified upper limb: Secondary | ICD-10-CM

## 2013-10-26 MED ORDER — MELOXICAM 15 MG PO TABS: 15.0000 mg | ORAL_TABLET | Freq: Every day | ORAL | Status: DC

## 2013-10-26 NOTE — Patient Instructions (Addendum)

## 2013-10-27 NOTE — Progress Notes (Signed)
   Subjective:    Patient ID: Erik Doyle, male    DOB: 09-07-1985, 28 y.o.   MRN: [id number]  HPI 28 year old male presents for evaluation of right hand pain and numbness x 3 days. Symptoms started after hearing a pop while working on a car.  Since then he has continued to have pain and intermittent paresthesias of his 3rd, 4th, and 5th fingers.  Has full ROM and normal strength.  States symptoms are worse at night and have awoken him from sleep several times.  He has not taken any medications or tried any conservative tx for this.  He is right handed.  Patient is otherwise healthy with no other concerns today.      Review of Systems  Musculoskeletal: Negative for joint swelling.  Skin: Negative for color change.  Neurological: Positive for numbness. Negative for weakness.       Objective:   Physical Exam  Constitutional: He is oriented to person, place, and time. He appears well-developed and well-nourished.  HENT:  Head: Normocephalic and atraumatic.  Right Ear: External ear normal.  Left Ear: External ear normal.  Eyes: Conjunctivae are normal.  Neck: Normal range of motion.  Cardiovascular: Normal rate.   Pulmonary/Chest: Effort normal.  Musculoskeletal:       Right wrist: He exhibits tenderness (+TTP over carpal tunnel). He exhibits normal range of motion and no bony tenderness.  +[place]  Neurological: He is alert and oriented to person, place, and time.  Psychiatric: He has a normal mood and affect. His behavior is normal. Judgment and thought content normal.      [facility] reading (PRIMARY) by  Dr. Clelia Croft as no acute bony abnormality.     Assessment & Plan:  Wrist pain, right - Plan: DG Wrist Complete Right, meloxicam (MOBIC) 15 MG tablet  Paresthesias  Carpal tunnel syndrome - Plan: meloxicam (MOBIC) 15 MG tablet  Placed in right wrist splint to wear during the day as needed and every night Recommend he wear this for at least 1 week, longer if needed - may remove  when symptoms completely resolve. Start Mobic 15 mg daily with food Recheck in 10-14 days if no improvement, sooner if worse.

## 2015-12-14 ENCOUNTER — Emergency Department (HOSPITAL_BASED_OUTPATIENT_CLINIC_OR_DEPARTMENT_OTHER): Payer: No Typology Code available for payment source

## 2015-12-14 ENCOUNTER — Encounter (HOSPITAL_BASED_OUTPATIENT_CLINIC_OR_DEPARTMENT_OTHER): Payer: Self-pay | Admitting: *Deleted

## 2015-12-14 ENCOUNTER — Emergency Department (HOSPITAL_BASED_OUTPATIENT_CLINIC_OR_DEPARTMENT_OTHER)
Admission: EM | Admit: 2015-12-14 | Discharge: 2015-12-14 | Disposition: A | Discharge status: Home / Self Care | Payer: No Typology Code available for payment source | Attending: Emergency Medicine | Admitting: Emergency Medicine

## 2015-12-14 DIAGNOSIS — Y9389 Activity, other specified: Secondary | ICD-10-CM | POA: Diagnosis not present

## 2015-12-14 DIAGNOSIS — Y92410 Unspecified street and highway as the place of occurrence of the external cause: Secondary | ICD-10-CM | POA: Diagnosis not present

## 2015-12-14 DIAGNOSIS — Y999 Unspecified external cause status: Secondary | ICD-10-CM | POA: Insufficient documentation

## 2015-12-14 DIAGNOSIS — S61411A Laceration without foreign body of right hand, initial encounter: Secondary | ICD-10-CM | POA: Diagnosis not present

## 2015-12-14 DIAGNOSIS — S6991XA Unspecified injury of right wrist, hand and finger(s), initial encounter: Secondary | ICD-10-CM | POA: Diagnosis present

## 2015-12-14 DIAGNOSIS — S82141A Displaced bicondylar fracture of right tibia, initial encounter for closed fracture: Secondary | ICD-10-CM

## 2015-12-14 DIAGNOSIS — S82144A Nondisplaced bicondylar fracture of right tibia, initial encounter for closed fracture: Secondary | ICD-10-CM | POA: Insufficient documentation

## 2015-12-14 MED ORDER — LIDOCAINE-EPINEPHRINE (PF) 2 %-1:200000 IJ SOLN
20.0000 mL | Freq: Once | INTRAMUSCULAR | Status: AC
  Administered 2015-12-14: 20 mL
  Filled 2015-12-14: qty 20

## 2015-12-14 MED ORDER — OXYCODONE-ACETAMINOPHEN 5-325 MG PO TABS: 1.0000 | ORAL_TABLET | Freq: Four times a day (QID) | ORAL | Status: AC | PRN

## 2015-12-14 MED ORDER — OXYCODONE-ACETAMINOPHEN 5-325 MG PO TABS
1.0000 | ORAL_TABLET | Freq: Once | ORAL | Status: AC
  Administered 2015-12-14: 1 via ORAL
  Filled 2015-12-14: qty 1

## 2015-12-14 NOTE — ED Notes (Signed)
Patient transported to X-ray 
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
## 2015-12-14 NOTE — ED Notes (Signed)
PA at bedside.
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
## 2015-12-14 NOTE — ED Notes (Signed)
Pt teaching provided on medications that may cause drowsiness. Pt instructed not to drive or operate heavy machinery while taking the prescribed medication. Pt verbalized understanding.   
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
